# Patient Record
Sex: Female | Born: 1955 | Race: White | Hispanic: No | Marital: Married | Smoking: Former smoker
Health system: Southern US, Community
[De-identification: ages and names within clinical notes are randomized; demographics above are authoritative.]

## PROBLEM LIST (undated history)

## (undated) DIAGNOSIS — K5790 Diverticulosis of intestine, part unspecified, without perforation or abscess without bleeding: Secondary | ICD-10-CM

## (undated) DIAGNOSIS — F329 Major depressive disorder, single episode, unspecified: Secondary | ICD-10-CM

## (undated) DIAGNOSIS — K635 Polyp of colon: Secondary | ICD-10-CM

## (undated) DIAGNOSIS — F32A Depression, unspecified: Secondary | ICD-10-CM

## (undated) DIAGNOSIS — D126 Benign neoplasm of colon, unspecified: Secondary | ICD-10-CM

## (undated) HISTORY — PX: CHOLECYSTECTOMY: SHX55

---

## 2011-03-15 ENCOUNTER — Ambulatory Visit: Payer: Self-pay | Admitting: Family Medicine

## 2011-03-19 ENCOUNTER — Ambulatory Visit: Payer: Self-pay | Admitting: Family Medicine

## 2011-04-27 ENCOUNTER — Ambulatory Visit: Payer: Self-pay | Admitting: Surgery

## 2011-04-27 HISTORY — PX: BREAST BIOPSY: SHX20

## 2011-04-28 LAB — PATHOLOGY REPORT

## 2011-07-09 ENCOUNTER — Ambulatory Visit: Payer: Self-pay | Admitting: Gastroenterology

## 2011-07-09 HISTORY — PX: COLON BIOPSY: SHX1369

## 2011-10-20 ENCOUNTER — Ambulatory Visit: Payer: Self-pay | Admitting: Surgery

## 2012-01-05 ENCOUNTER — Observation Stay: Payer: Self-pay | Admitting: Surgery

## 2012-01-05 LAB — COMPREHENSIVE METABOLIC PANEL
Albumin: 4.2 g/dL (ref 3.4–5.0)
Alkaline Phosphatase: 114 U/L (ref 50–136)
Anion Gap: 9 (ref 7–16)
BUN: 8 mg/dL (ref 7–18)
Calcium, Total: 9.1 mg/dL (ref 8.5–10.1)
Chloride: 102 mmol/L (ref 98–107)
EGFR (Non-African Amer.): >60
Glucose: 109 mg/dL — ABNORMAL HIGH (ref 65–99)
Osmolality: 278 (ref 275–301)
Potassium: 3.7 mmol/L (ref 3.5–5.1)
SGPT (ALT): 389 U/L — ABNORMAL HIGH
Sodium: 140 mmol/L (ref 136–145)
Total Protein: 7.2 g/dL (ref 6.4–8.2)

## 2012-01-05 LAB — URINALYSIS, COMPLETE
Glucose,UR: NEGATIVE mg/dL (ref 0–75)
Leukocyte Esterase: NEGATIVE
Nitrite: NEGATIVE
Protein: 100
WBC UR: 14 /HPF (ref 0–5)

## 2012-01-05 LAB — CBC WITH DIFFERENTIAL/PLATELET
Basophil #: 0 10*3/uL (ref 0.0–0.1)
Lymphocyte %: 14.3 %
MCH: 29.9 pg (ref 26.0–34.0)
MCHC: 33.8 g/dL (ref 32.0–36.0)
Monocyte #: 0.5 10*3/uL (ref 0.0–0.7)
Monocyte %: 5.5 %
Neutrophil %: 79.2 %
Platelet: 242 10*3/uL (ref 150–440)
RDW: 13.2 % (ref 11.5–14.5)
WBC: 8.6 10*3/uL (ref 3.6–11.0)

## 2012-01-05 LAB — LIPASE, BLOOD: Lipase: 102 U/L (ref 73–393)

## 2012-01-07 LAB — PATHOLOGY REPORT

## 2012-05-08 ENCOUNTER — Ambulatory Visit: Payer: Self-pay | Admitting: Family Medicine

## 2012-11-09 ENCOUNTER — Ambulatory Visit: Payer: Self-pay | Admitting: Family Medicine

## 2015-03-30 NOTE — H&P (Signed)
Subjective/Chief Complaint Severe RUQ pain, nausea and emesis    History of Present Illness 59 year old female awoke with dudden onset on upper back pain and RUQ/epigastric pain followed by nausea and emesis at 3 am tuesday am.  She has been unable to keep po as it is followed by emesis.  Nearly constant discomfort since onset of symptoms.  About 1 tear ago had similar but less intense and shorter lived type pain/symptom complex.  No fevers, no jaundice, no sick contacts.  3 previous c-sections.  Colonoscopy aug 2012 normal.    Past History as above   ALLERGIES:  No Known Allergies:     Medications takes no medications regularly.   Family and Social History:   Family History Non-Contributory    Social History positive  tobacco, positive ETOH, occ. use of both    + Tobacco Current (within 1 year)    Place of Living Home   Review of Systems:   Subjective/Chief Complaint as described above    Abdominal Pain Yes    Constipation No    Nausea/Vomiting Yes    Tolerating Diet No  Nauseated  Vomiting    Medications/Allergies Reviewed Medications/Allergies reviewed   Physical Exam:   GEN NAD, disheveled, temp 97.7 73 bp 149/59    HEENT pale conjunctivae    NECK supple    RESP normal resp effort  clear BS    CARD regular rate  no murmur  no thrills  No LE edema  no JVD  no Rub    ABD positive tenderness  no hernia  normal BS  positive murphy's sign, no mass, no hernia, lower midline scar present.    EXTR positive cyanosis/clubbing, negative cyanosis/clubbing    SKIN normal to palpation    NEURO cranial nerves intact    PSYCH A+O to time, place, person     Routine UA:  30-Jan-13 14:37    Color (UA) Amber   Clarity (UA) Turbid   Glucose (UA) Negative   Bilirubin (UA) Negative   Ketones (UA) Negative   Specific Gravity (UA) 1.023   Blood (UA) Negative   pH (UA) 8.0   Nitrite (UA) Negative   Leukocyte Esterase (UA) Negative   RBC (UA) 4 /HPF   WBC (UA) 14  /HPF   Bacteria (UA) TRACE   Epithelial Cells (UA) 1 /HPF   Mucous (UA) PRESENT   Budding Yeast (UA) PRESENT  Routine Chem:  30-Jan-13 15:06    Lipase 102  Routine Hem:  30-Jan-13 15:06    WBC (CBC) 8.6   RBC (CBC) 4.91   Hemoglobin (CBC) 14.7   Hematocrit (CBC) 43.4   Platelet Count (CBC) 242   MCV 89   MCH 29.9   MCHC 33.8   RDW 13.2   Neutrophil % 79.2   Lymphocyte % 14.3   Monocyte % 5.5   Eosinophil % 0.7   Basophil % 0.3   Neutrophil # 6.8   Lymphocyte # 1.2   Monocyte # 0.5   Eosinophil # 0.1   Basophil # 0.0  Routine Chem:  30-Jan-13 15:06    Glucose, Serum 109   BUN 8   Creatinine (comp) 0.72   Sodium, Serum 140   Potassium, Serum 3.7   Chloride, Serum 102   CO2, Serum 29   Calcium (Total), Serum 9.1  Hepatic:  30-Jan-13 15:06    Bilirubin, Total 1.6   Alkaline Phosphatase 114   SGPT (ALT) 389   SGOT (AST) 301  Total Protein, Serum 7.2   Albumin, Serum 4.2  Routine Chem:  30-Jan-13 15:06    Osmolality (calc) 278   eGFR (African American) >60   eGFR (Non-African American) >60   Anion Gap 9   Radiology Results:  [place]:    30-Jan-13 17:48, US Abdomen General Survey   US Abdomen General Survey   REASON FOR EXAM:    upper abd pain radiating to back; elevated lfts  COMMENTS:       PROCEDURE: [place]  - US ABDOMEN GENERAL SURVEY  - Jan 05 2012  5:48PM     RESULT: Comparison: None.    Technique: Multiple grayscale and color Doppler images were obtained of   the abdomen.    Findings:  The liver is diffusely echogenic and dense, consistent with hepatic   steatosis. The visualized spleen and pancreas are unremarkable. There are   multiple mobile stones in the gallbladder. The sonographic Percell Miller sign  was negative. No pericholecystic fluid or gallbladder wall thickening.     There is mild prominence of the intrahepatic biliary ducts. The common   bile duct measures 8 mm.    Images of the kidneys showed no hydronephrosis.    IMPRESSION:   1.  Cholelithiasis, without sonographic evidence of acute cholecystitis.  2. The common bile duct is dilated. The etiology for this dilatation is   not identified on this study. Further evaluation could be provided with   M.R.C.P. or ERCP, as indicated.  3. Hepatic steatosis.          Verified By: Gregor Hams, M.D., MD     Assessment/Admission Diagnosis 59 year old with acute cholecystitis    Plan Admit, npo, lap chole with IOC later tonight.  Discussed with patient and husband procedure including risks of surgery including bile duct injury, bleeding, infection, need for conversion to open operation.  [place] personally reviewed on PACS.  total time spent 45 minutes.   Electronic Signatures: Sherri Rad (MD)  (Signed 30-Jan-13 20:29)  Authored: CHIEF COMPLAINT and HISTORY, ALLERGIES, HOME MEDICATIONS, OTHER MEDICATIONS, FAMILY AND SOCIAL HISTORY, REVIEW OF SYSTEMS, PHYSICAL EXAM, LABS, Radiology, ASSESSMENT AND PLAN   Last Updated: 30-Jan-13 20:29 by Sherri Rad (MD)

## 2015-03-30 NOTE — Op Note (Signed)
PATIENT NAME:  Kristen Walls, Kristen Walls MR#:  [id number] DATE OF BIRTH:  05-29-1956  DATE OF PROCEDURE:  01/05/2012  PREOPERATIVE DIAGNOSIS: Acute calculus cholecystitis.   POSTOPERATIVE DIAGNOSIS: Acute calculus cholecystitis.   PROCEDURE PERFORMED: Laparoscopic cholecystectomy.   SURGEON: Sherri Rad, M.D. FACS  ASSISTANT: Surgical scrub technologist.   TYPE OF ANESTHESIA: General endotracheal, Dr. Vashti Hey   FINDINGS: Acute calculus cholecystitis.   SPECIMENS: Gallbladder with contents.   ESTIMATED BLOOD LOSS: 50 mL.   DRAINS: 19-mm Blake drain.   DESCRIPTION OF PROCEDURE: With the patient in the supine position, general oral endotracheal anesthesia was induced. SCDs and Invanz were administered in an appropriate preoperative fashion. The patient's left arm was padded and tucked at her side. A time-out was observed. A 12-mm blunt Hassan trocar was placed through an infraumbilical transversely- oriented skin incision with stay sutures being passed through the fascia. Pneumoperitoneum was established to a total of 15 mmHg. The patient was then positioned in reverse Trendelenburg and airplaned right side up. A 5-mm Bladeless trocar was placed in the epigastrium. Two 5-mm Bladeless ports were then placed in the right subcostal margin. The gallbladder was aspirated of approximately 30 mL of thick bile. This allowed [place] to place a grasper on the fundus. The gallbladder was then elevated towards the right shoulder. Lateral traction was placed on Hartman's pouch. Dissection in this area demonstrated a cystic duct which was triply clipped on the portal side, singly clipped on the gallbladder side, and divided. Immediately adjacent, a cystic artery was identified. Two clips were then placed on the portal side, a single clip on the gallbladder side, and divided. One small posterior cystic artery branch was divided between two clips on the liver side, one clip on the gallbladder side, and sharp scissors. The  gallbladder was then retrieved off the gallbladder fossa utilizing hook cautery apparatus and placed into an Endo Catch device.  It  was then retrieved through the infraumbilical port site without difficulty. Pneumoperitoneum was re-established. The right upper quadrant was irrigated of all free bile with a total of 2 liters of warm normal saline and aspirated dry. A 19-millimeter Blake drain was directed into the space below the gallbladder fossa and exited the lowermost right upper quadrant port site. It was placed into the gallbladder fossa. Drain site was secured with nylon suture. Ports were then removed under direct visualization. Hemostasis appeared to be adequate.   The infraumbilical fascial defect was closed with an additional figure-of-eight #0 Vicryl suture in vertical orientation. The existing stay sutures were tied to each other. A total of 25 mL of 0.25% plain Marcaine was infiltrated along all skin and fascial incisions prior to closure.  4-0 Monocryl was used in subcuticular fashion to reapproximate skin edges. Benzoin, Steri-Strips, Telfa, and Tegaderm were then applied. The patient was then subsequently extubated and taken to the recovery room in stable and satisfactory condition by anesthesia services.     ____________________________ Jeannette How Marina Gravel, MD FACS mab:bjt D: 01/05/2012 23:55:37 ET T: 01/06/2012 10:05:26 ET JOB#: [id number]  cc: Elta Guadeloupe A. Marina Gravel, MD, <Dictator> Hortencia Conradi MD ELECTRONICALLY SIGNED 01/06/2012 21:39

## 2016-04-23 ENCOUNTER — Other Ambulatory Visit: Payer: Self-pay | Admitting: Family Medicine

## 2016-04-23 DIAGNOSIS — Z1231 Encounter for screening mammogram for malignant neoplasm of breast: Secondary | ICD-10-CM

## 2016-05-12 ENCOUNTER — Ambulatory Visit
Admission: RE | Admit: 2016-05-12 | Discharge: 2016-05-12 | Disposition: A | Discharge status: Home / Self Care | Source: Ambulatory Visit | Attending: Family Medicine | Admitting: Family Medicine

## 2016-05-12 DIAGNOSIS — Z1231 Encounter for screening mammogram for malignant neoplasm of breast: Secondary | ICD-10-CM | POA: Insufficient documentation

## 2016-05-18 ENCOUNTER — Other Ambulatory Visit: Payer: Self-pay | Admitting: Family Medicine

## 2016-05-18 DIAGNOSIS — R928 Other abnormal and inconclusive findings on diagnostic imaging of breast: Secondary | ICD-10-CM

## 2016-06-03 ENCOUNTER — Ambulatory Visit
Admission: RE | Admit: 2016-06-03 | Discharge: 2016-06-03 | Disposition: A | Discharge status: Home / Self Care | Source: Ambulatory Visit | Attending: Family Medicine | Admitting: Family Medicine

## 2016-06-03 DIAGNOSIS — R928 Other abnormal and inconclusive findings on diagnostic imaging of breast: Secondary | ICD-10-CM

## 2016-06-03 DIAGNOSIS — N63 Unspecified lump in breast: Secondary | ICD-10-CM | POA: Diagnosis not present

## 2017-03-10 ENCOUNTER — Encounter: Payer: Self-pay | Admitting: *Deleted

## 2017-03-11 ENCOUNTER — Ambulatory Visit: Admitting: Certified Registered Nurse Anesthetist

## 2017-03-11 ENCOUNTER — Encounter
Admission: RE | Disposition: A | Discharge status: Home / Self Care | Payer: Self-pay | Source: Ambulatory Visit | Attending: Gastroenterology

## 2017-03-11 ENCOUNTER — Ambulatory Visit
Admission: RE | Admit: 2017-03-11 | Discharge: 2017-03-11 | Disposition: A | Discharge status: Home / Self Care | Source: Ambulatory Visit | Attending: Gastroenterology | Admitting: Gastroenterology

## 2017-03-11 ENCOUNTER — Encounter: Payer: Self-pay | Admitting: Anesthesiology

## 2017-03-11 DIAGNOSIS — Z86010 Personal history of colonic polyps: Secondary | ICD-10-CM | POA: Insufficient documentation

## 2017-03-11 DIAGNOSIS — K5730 Diverticulosis of large intestine without perforation or abscess without bleeding: Secondary | ICD-10-CM | POA: Diagnosis not present

## 2017-03-11 DIAGNOSIS — F17200 Nicotine dependence, unspecified, uncomplicated: Secondary | ICD-10-CM | POA: Insufficient documentation

## 2017-03-11 DIAGNOSIS — K640 First degree hemorrhoids: Secondary | ICD-10-CM | POA: Insufficient documentation

## 2017-03-11 DIAGNOSIS — Z79899 Other long term (current) drug therapy: Secondary | ICD-10-CM | POA: Insufficient documentation

## 2017-03-11 DIAGNOSIS — F329 Major depressive disorder, single episode, unspecified: Secondary | ICD-10-CM | POA: Insufficient documentation

## 2017-03-11 DIAGNOSIS — Z1211 Encounter for screening for malignant neoplasm of colon: Secondary | ICD-10-CM | POA: Diagnosis present

## 2017-03-11 DIAGNOSIS — D123 Benign neoplasm of transverse colon: Secondary | ICD-10-CM | POA: Insufficient documentation

## 2017-03-11 DIAGNOSIS — K621 Rectal polyp: Secondary | ICD-10-CM | POA: Diagnosis not present

## 2017-03-11 DIAGNOSIS — Z888 Allergy status to other drugs, medicaments and biological substances status: Secondary | ICD-10-CM | POA: Insufficient documentation

## 2017-03-11 DIAGNOSIS — K635 Polyp of colon: Secondary | ICD-10-CM | POA: Insufficient documentation

## 2017-03-11 HISTORY — PX: COLONOSCOPY WITH PROPOFOL: SHX5780

## 2017-03-11 HISTORY — DX: Major depressive disorder, single episode, unspecified: F32.9

## 2017-03-11 HISTORY — DX: Depression, unspecified: F32.A

## 2017-03-11 SURGERY — COLONOSCOPY WITH PROPOFOL
Anesthesia: General

## 2017-03-11 MED ORDER — FENTANYL CITRATE (PF) 100 MCG/2ML IJ SOLN
INTRAMUSCULAR | Status: DC | PRN
  Administered 2017-03-11: 50 ug via INTRAVENOUS

## 2017-03-11 MED ORDER — LIDOCAINE HCL (PF) 2 % IJ SOLN
INTRAMUSCULAR | Status: DC | PRN
  Administered 2017-03-11: 40 mg via INTRADERMAL
  Administered 2017-03-11: 60 mg via INTRADERMAL

## 2017-03-11 MED ORDER — PROPOFOL 10 MG/ML IV BOLUS
INTRAVENOUS | Status: DC | PRN
  Administered 2017-03-11: 20 mg via INTRAVENOUS
  Administered 2017-03-11: 70 mg via INTRAVENOUS

## 2017-03-11 MED ORDER — GLYCOPYRROLATE 0.2 MG/ML IJ SOLN
INTRAMUSCULAR | Status: DC | PRN
  Administered 2017-03-11: 0.2 mg via INTRAVENOUS

## 2017-03-11 MED ORDER — PROPOFOL 500 MG/50ML IV EMUL
INTRAVENOUS | Status: AC
  Filled 2017-03-11: qty 50

## 2017-03-11 MED ORDER — PHENYLEPHRINE HCL 10 MG/ML IJ SOLN
INTRAMUSCULAR | Status: DC | PRN
  Administered 2017-03-11 (×5): 100 ug via INTRAVENOUS

## 2017-03-11 MED ORDER — PROPOFOL 500 MG/50ML IV EMUL
INTRAVENOUS | Status: DC | PRN
  Administered 2017-03-11: 150 ug/kg/min via INTRAVENOUS

## 2017-03-11 MED ORDER — SODIUM CHLORIDE 0.9 % IV SOLN: INTRAVENOUS | Status: DC

## 2017-03-11 MED ORDER — LIDOCAINE HCL (PF) 2 % IJ SOLN
INTRAMUSCULAR | Status: AC
  Filled 2017-03-11: qty 2

## 2017-03-11 MED ORDER — SODIUM CHLORIDE 0.9 % IV SOLN
INTRAVENOUS | Status: DC
  Administered 2017-03-11: 11:00:00 via INTRAVENOUS

## 2017-03-11 MED ORDER — FENTANYL CITRATE (PF) 100 MCG/2ML IJ SOLN
INTRAMUSCULAR | Status: AC
  Filled 2017-03-11: qty 2

## 2017-03-11 NOTE — Op Note (Signed)
[facility] Patient Name: Kristen Walls Procedure Date: 03/11/2017 10:49 AM MRN: [id number] Account #: [id number] Date of Birth: November 04, 1956 Admit Type: Outpatient Age: 61 Room: [facility] ENDO ROOM 1 Gender: Female Note Status: Finalized Procedure:            Colonoscopy Indications:          Personal history of colonic polyps Providers:            Lollie Sails, MD Referring MD:         Irven Easterly. Kary Kos, MD (Referring MD) Medicines:            Monitored Anesthesia Care Complications:        No immediate complications. Procedure:            Pre-Anesthesia Assessment:                       - ASA Grade Assessment: III - A patient with severe                        systemic disease.                       After obtaining informed consent, the colonoscope was                        passed under direct vision. Throughout the procedure,                        the patient's blood pressure, pulse, and oxygen                        saturations were monitored continuously. The                        Colonoscope was introduced through the anus and                        advanced to the the cecum, identified by appendiceal                        orifice and ileocecal valve. The colonoscopy was                        unusually difficult due to poor bowel prep and a                        tortuous colon. Successful completion of the procedure                        was aided by changing the patient to a supine position,                        changing the patient to a prone position and lavage.                        The patient tolerated the procedure well. The quality                        of the bowel preparation was fair except the cecum was  poor. Findings:      A 16 mm polyp was found in the proximal transverse colon. The polyp was       sessile. The polyp was removed with a cold biopsy forceps. The polyp was       removed with a  cold snare. The polyp was removed with a lift and cut       technique using a cold snare. The polyp was removed with a piecemeal       technique using a cold snare. Resection and retrieval were complete.      A 3 mm polyp was found in the ileocecal valve. The polyp was sessile.       The polyp was removed with a cold biopsy forceps. Resection and       retrieval were complete.      Five sessile polyps were found in the distal sigmoid colon. The polyps       were 1 to 3 mm in size. These polyps were removed with a cold biopsy       forceps. Resection and retrieval were complete.      A 5 mm polyp was found in the rectum. The polyp was sessile. The polyp       was removed with a cold snare. Resection and retrieval were complete.      Multiple small-mouthed diverticula were found in the sigmoid colon and       descending colon.      Non-bleeding external and internal hemorrhoids were found during       anoscopy. The hemorrhoids were small and Grade I (internal hemorrhoids       that do not prolapse).      The digital rectal exam was normal.      No additional abnormalities were found on retroflexion. Impression:           - One 16 mm polyp in the proximal transverse colon,                        removed with a cold snare, removed using lift and cut                        and a cold snare, removed piecemeal using a cold snare                        and removed with a cold biopsy forceps. Resected and                        retrieved.                       - One 3 mm polyp at the ileocecal valve, removed with a                        cold biopsy forceps. Resected and retrieved.                       - Five 1 to 3 mm polyps in the distal sigmoid colon,                        removed with a cold biopsy forceps. Resected and  retrieved.                       - One 5 mm polyp in the rectum, removed with a cold                        snare. Resected and retrieved.                        - Diverticulosis in the sigmoid colon and in the                        descending colon. Recommendation:       - Await pathology results.                       - Telephone GI clinic for pathology results in 1 week. Procedure Code(s):    --- Professional ---                       [phone], Colonoscopy, flexible; with removal of tumor(s),                        polyp(s), or other lesion(s) by snare technique                       [id number], 28, Colonoscopy, flexible; with biopsy, single                        or multiple Diagnosis Code(s):    --- Professional ---                       D12.3, Benign neoplasm of transverse colon (hepatic                        flexure or splenic flexure)                       D12.0, Benign neoplasm of cecum                       K62.1, Rectal polyp                       D12.5, Benign neoplasm of sigmoid colon                       Z86.010, Personal history of colonic polyps                       K57.30, Diverticulosis of large intestine without                        perforation or abscess without bleeding CPT copyright 2016 American Medical Association. All rights reserved. The codes documented in this report are preliminary and upon coder review may  be revised to meet current compliance requirements. Lollie Sails, MD 03/11/2017 11:53:21 AM This report has been signed electronically. Number of Addenda: 0 Note Initiated On: 03/11/2017 10:49 AM Scope Withdrawal Time: 0 hours 32 minutes 9 seconds  Total Procedure Duration: 0 hours 44 minutes 14 seconds       [facility]

## 2017-03-11 NOTE — Anesthesia Preprocedure Evaluation (Signed)
Anesthesia Evaluation  Patient identified by MRN, date of birth, ID band Patient awake    Reviewed: Allergy & Precautions, NPO status , Patient's Chart, lab work & pertinent test results  Airway Mallampati: III       Dental   Pulmonary Current Smoker,    Pulmonary exam normal        Cardiovascular negative cardio ROS Normal cardiovascular exam     Neuro/Psych PSYCHIATRIC DISORDERS Depression    GI/Hepatic negative GI ROS, Neg liver ROS,   Endo/Other  negative endocrine ROS  Renal/GU negative Renal ROS  negative genitourinary   Musculoskeletal negative musculoskeletal ROS (+)   Abdominal Normal abdominal exam  (+)   Peds negative pediatric ROS (+)  Hematology negative hematology ROS (+)   Anesthesia Other Findings   Reproductive/Obstetrics                             Anesthesia Physical Anesthesia Plan  ASA: II  Anesthesia Plan: General   Post-op Pain Management:    Induction: Intravenous  Airway Management Planned: Nasal Cannula  Additional Equipment:   Intra-op Plan:   Post-operative Plan:   Informed Consent: I have reviewed the patients History and Physical, chart, labs and discussed the procedure including the risks, benefits and alternatives for the proposed anesthesia with the patient or authorized representative who has indicated his/her understanding and acceptance.   Dental advisory given  Plan Discussed with: CRNA and Surgeon  Anesthesia Plan Comments:         Anesthesia Quick Evaluation

## 2017-03-11 NOTE — Transfer of Care (Signed)
Immediate Anesthesia Transfer of Care Note  Patient: Kristen Walls  Procedure(s) Performed: Procedure(s): COLONOSCOPY WITH PROPOFOL (N/A)  Patient Location: PACU  Anesthesia Type:General  Level of Consciousness: awake and alert   Airway & Oxygen Therapy: Patient Spontanous Breathing and Patient connected to nasal cannula oxygen  Post-op Assessment: Report given to RN and Post -op Vital signs reviewed and stable  Post vital signs: Reviewed and stable  Last Vitals:  Vitals:   03/11/17 0946  BP: 110/67  Pulse: 83  Resp: 16  Temp: (!) 36 C    Last Pain:  Vitals:   03/11/17 0946  TempSrc: Tympanic         Complications: No apparent anesthesia complications

## 2017-03-11 NOTE — H&P (Signed)
Outpatient short stay form Pre-procedure 03/11/2017 10:40 AM Kristen Sails MD  Primary Physician: Dr. [place] Pink  Reason for visit:  Colonoscopy  History of present illness:  Patient is a 61 year old female presenting today as above. She tolerated her prep well. She takes no aspirin or blood thinning agents. Her last colonoscopy was in 2012. At that time there were 2 adenomas removed as well as a hyperplastic polyp.    Current Facility-Administered Medications:  .  0.9 %  sodium chloride infusion, , Intravenous, Continuous, Kristen Sails, MD .  0.9 %  sodium chloride infusion, , Intravenous, Continuous, Kristen Sails, MD  Prescriptions Prior to Admission  Medication Sig Dispense Refill Last Dose  . cholecalciferol (VITAMIN D) 400 units TABS tablet Take 1,000 Units by mouth.   Past Week at Unknown time  . Multiple Vitamin (MULTIVITAMIN) tablet Take 1 tablet by mouth daily.   Past Week at Unknown time  . OMEGA-3 FATTY ACIDS-VITAMIN E PO Take 1,000 mg by mouth daily.   Past Week at Unknown time  . sertraline (ZOLOFT) 50 MG tablet Take 50 mg by mouth daily.   03/10/2017 at Unknown time  . varenicline (CHANTIX) 1 MG tablet Take 1 mg by mouth 2 (two) times daily.   Not Taking at Unknown time     Allergies  Allergen Reactions  . Wellbutrin [Bupropion] Hives     Past Medical History:  Diagnosis Date  . Depression     Review of systems:      Physical Exam    Heart and lungs: Regular rate and rhythm without rub or gallop, lungs are bilaterally clear.    HEENT: Normocephalic atraumatic eyes are anicteric    Other:     Pertinant exam for procedure: Soft nontender nondistended bowel sounds positive normoactive.   Planned proceedures: Colonoscopy and indicated procedures I have discussed the risks benefits and complications of procedures to include not limited to bleeding, infection, perforation and the risk of sedation and the patient wishes to proceed.    Kristen Sails, MD Gastroenterology 03/11/2017  10:40 AM

## 2017-03-11 NOTE — Anesthesia Post-op Follow-up Note (Cosign Needed)
Anesthesia QCDR form completed.        

## 2017-03-14 ENCOUNTER — Encounter: Payer: Self-pay | Admitting: Gastroenterology

## 2017-03-14 LAB — SURGICAL PATHOLOGY

## 2017-03-16 NOTE — Anesthesia Postprocedure Evaluation (Signed)
Anesthesia Post Note  Patient: Kristen Walls  Procedure(s) Performed: Procedure(s) (LRB): COLONOSCOPY WITH PROPOFOL (N/A)  Patient location during evaluation: Endoscopy Anesthesia Type: General Level of consciousness: awake and alert Pain management: pain level controlled Vital Signs Assessment: post-procedure vital signs reviewed and stable Respiratory status: spontaneous breathing, nonlabored ventilation, respiratory function stable and patient connected to nasal cannula oxygen Cardiovascular status: blood pressure returned to baseline and stable Postop Assessment: no signs of nausea or vomiting Anesthetic complications: no     Last Vitals:  Vitals:   03/11/17 1226 03/11/17 1236  BP: 113/60 130/63  Pulse: 68 62  Resp: 17 15  Temp:      Last Pain:  Vitals:   03/12/17 1326  TempSrc:   PainSc: 0-No pain                 Martha Clan

## 2017-05-06 ENCOUNTER — Other Ambulatory Visit: Payer: Self-pay | Admitting: Family Medicine

## 2017-05-06 DIAGNOSIS — Z1231 Encounter for screening mammogram for malignant neoplasm of breast: Secondary | ICD-10-CM

## 2017-05-24 ENCOUNTER — Ambulatory Visit
Admission: RE | Admit: 2017-05-24 | Discharge: 2017-05-24 | Disposition: A | Discharge status: Home / Self Care | Source: Ambulatory Visit | Attending: Family Medicine | Admitting: Family Medicine

## 2017-05-24 DIAGNOSIS — Z1231 Encounter for screening mammogram for malignant neoplasm of breast: Secondary | ICD-10-CM | POA: Diagnosis not present

## 2017-10-23 IMAGING — MG MM DIGITAL SCREENING BILAT W/ CAD
4 series · 4 of 4 positions shown · non-contrast
Comparison: Previous exam(s).

CLINICAL DATA: Screening.

EXAM:
DIGITAL SCREENING BILATERAL MAMMOGRAM WITH CAD

[L MLO]
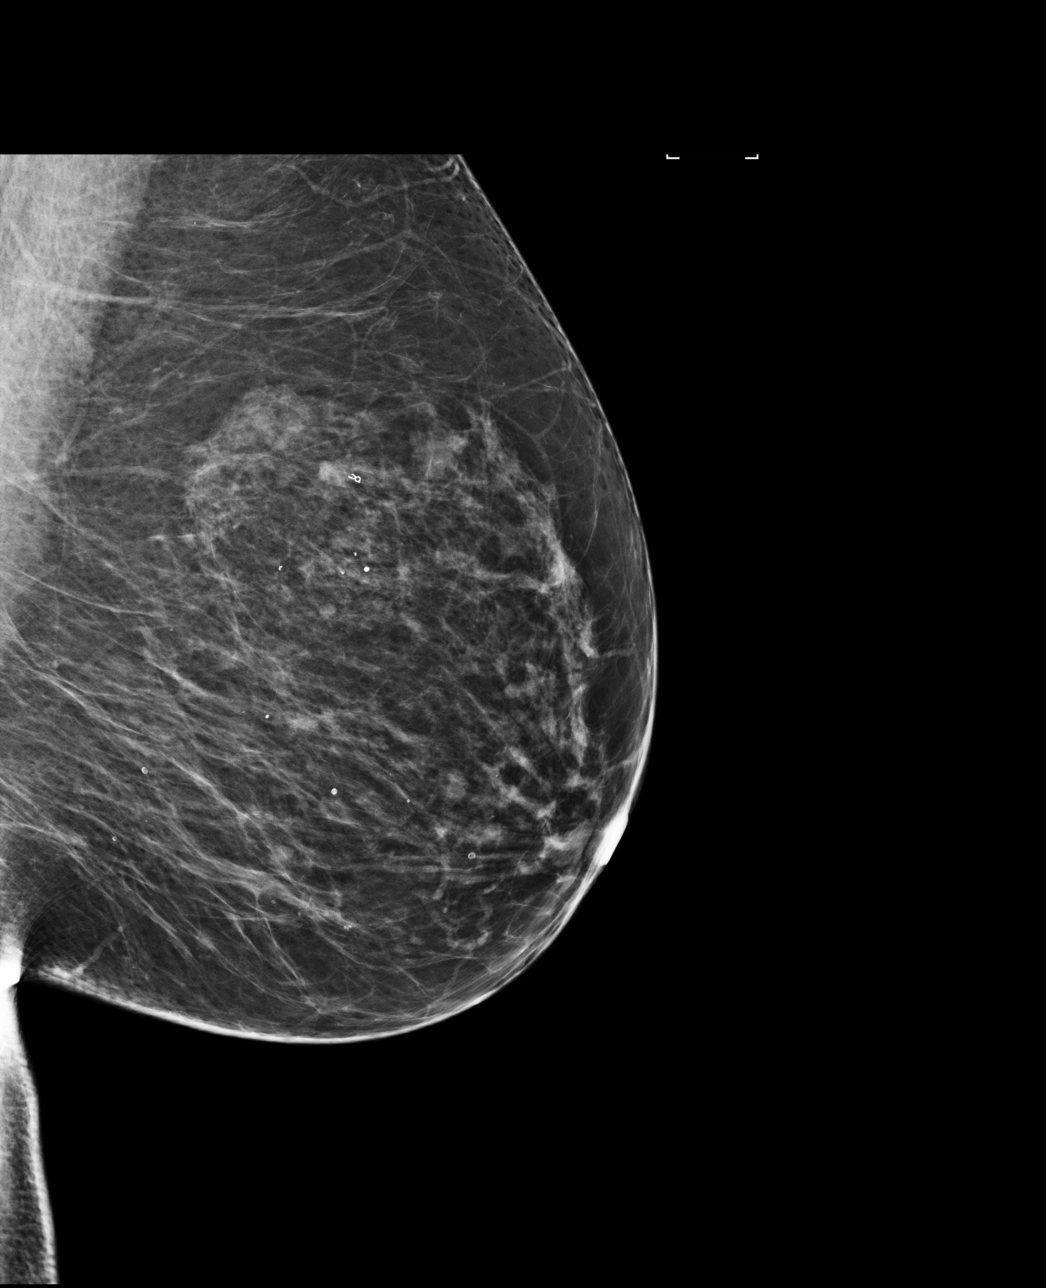

[R CC]
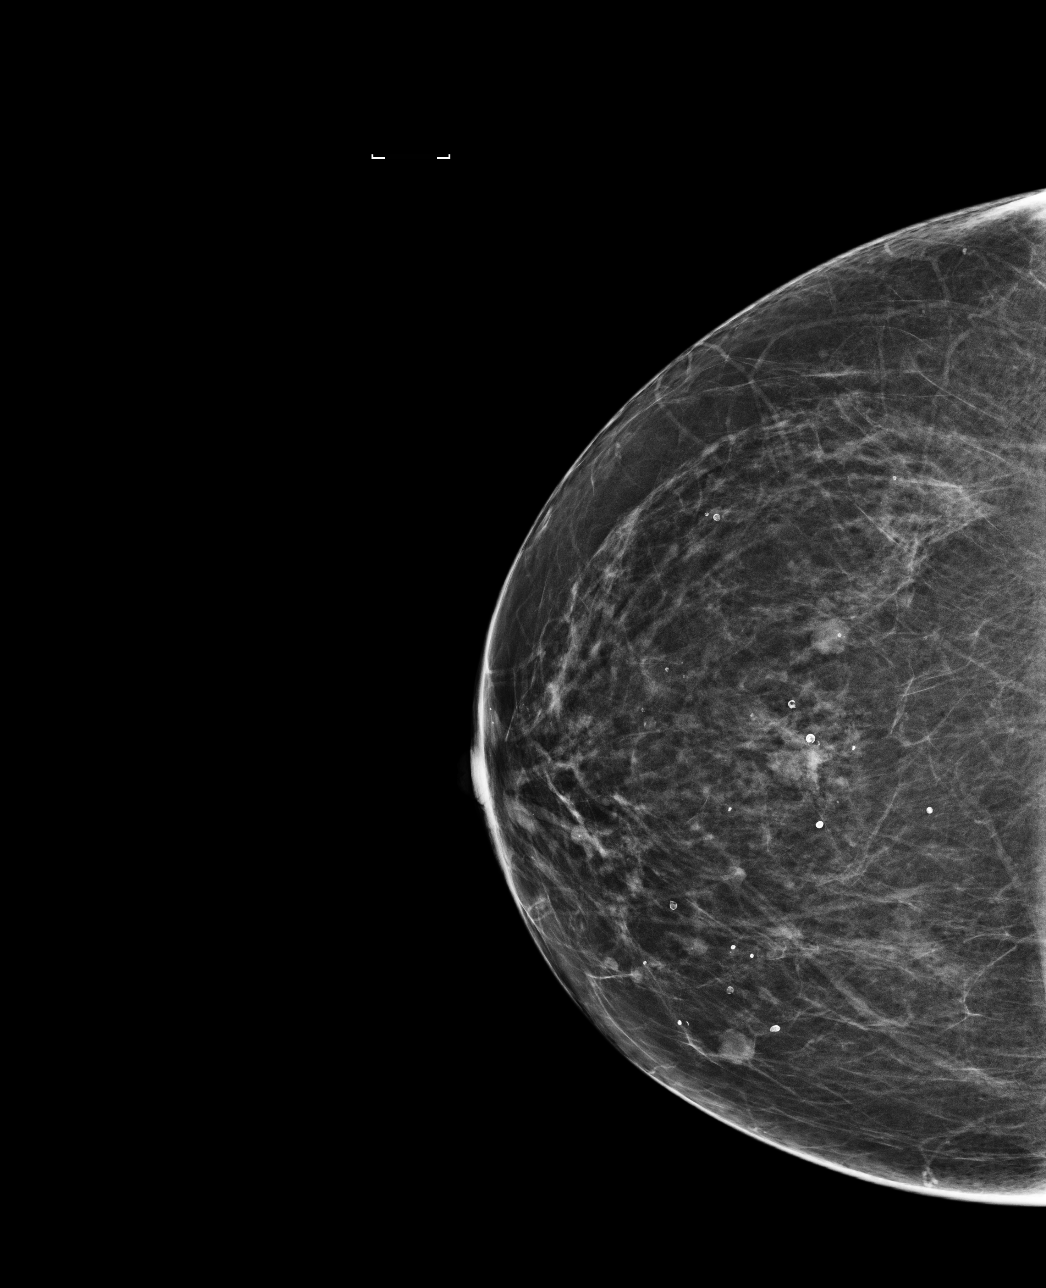

[L CC]
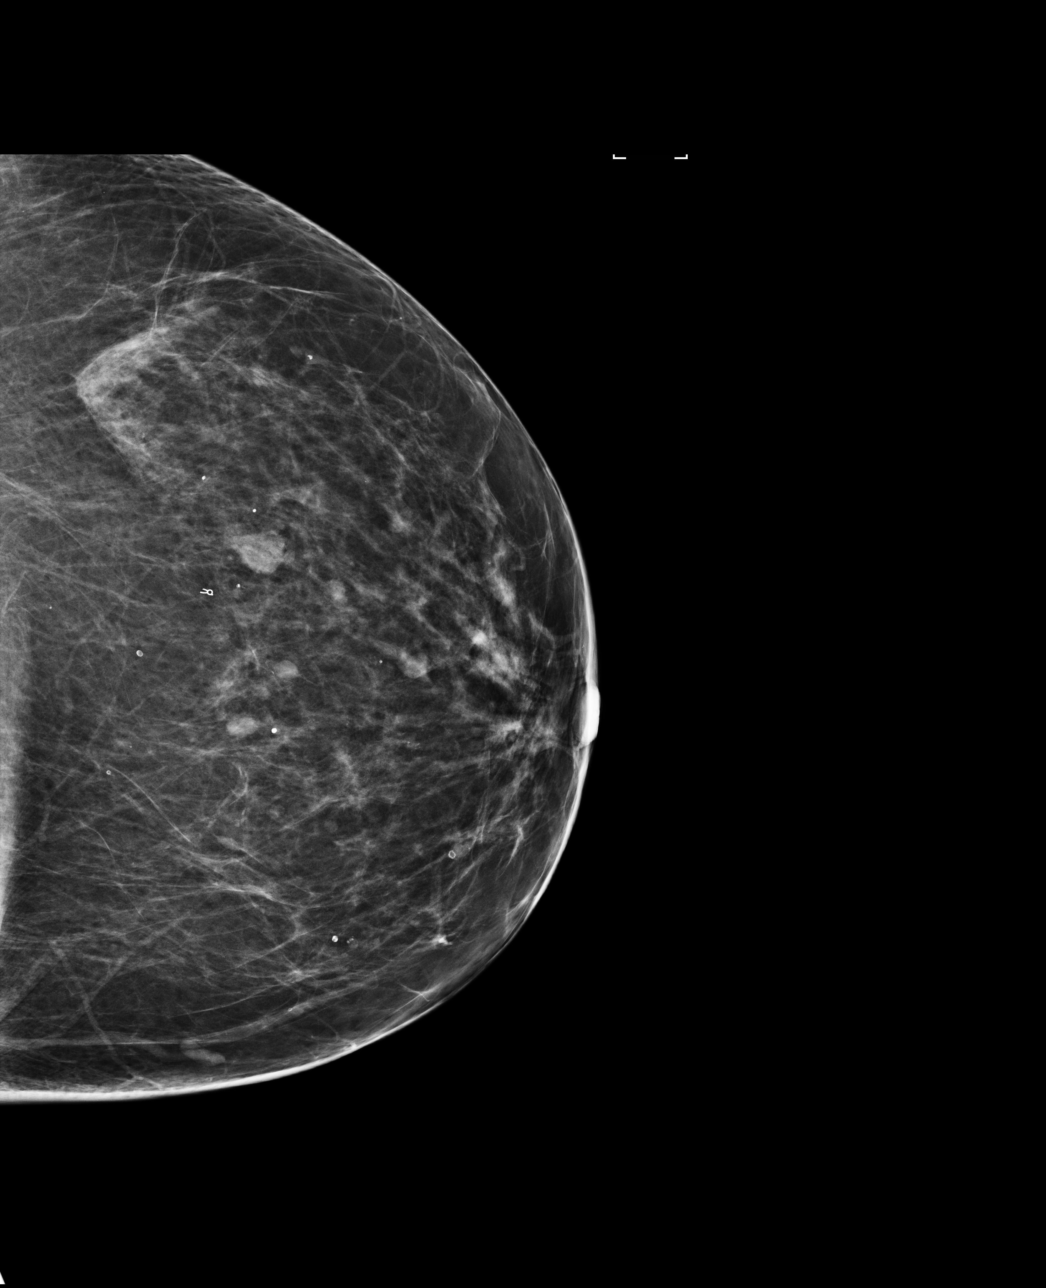

[R MLO]
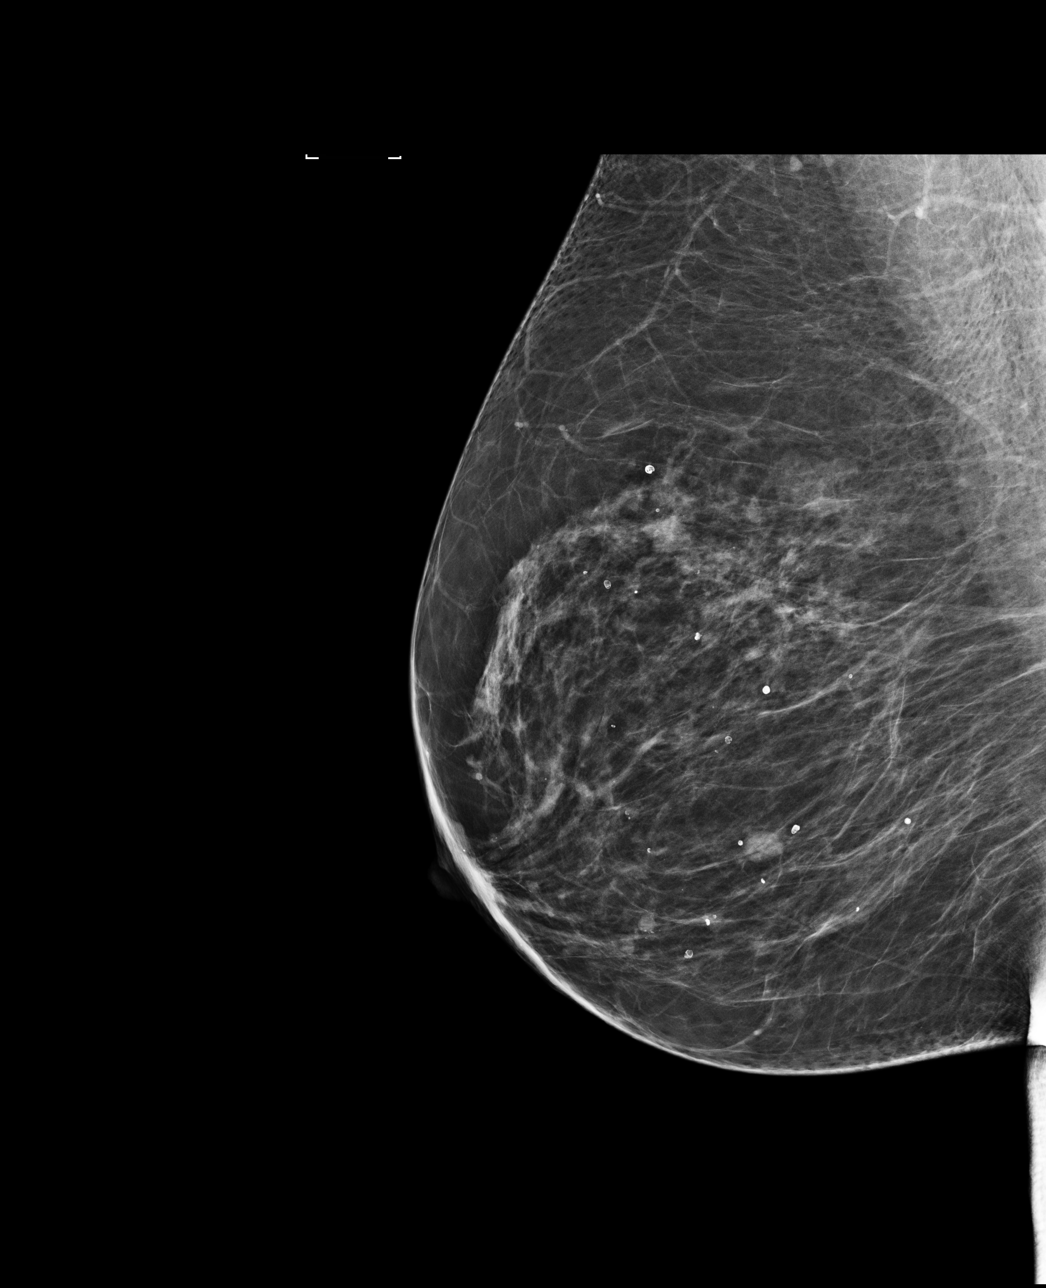

[4 of 4 positions shown; findings below may reference images not displayed]

ACR Breast Density Category b: There are scattered areas of
fibroglandular density.
FINDINGS: In the left breast, a possible mass warrants further evaluation. In
the right breast, no findings suspicious for malignancy.

Images were processed with CAD.
IMPRESSION: Further evaluation is suggested for possible mass in the left
breast.

RECOMMENDATION:
Diagnostic mammogram and possibly ultrasound of the left breast.
(Code:5M-3-PPT)

The patient will be contacted regarding the findings, and additional
imaging will be scheduled.

BI-RADS CATEGORY  0: Incomplete. Need additional imaging evaluation
and/or prior mammograms for comparison.

## 2018-06-19 ENCOUNTER — Other Ambulatory Visit: Payer: Self-pay | Admitting: Family Medicine

## 2018-06-19 DIAGNOSIS — Z1231 Encounter for screening mammogram for malignant neoplasm of breast: Secondary | ICD-10-CM

## 2018-07-05 ENCOUNTER — Ambulatory Visit
Admission: RE | Admit: 2018-07-05 | Discharge: 2018-07-05 | Disposition: A | Discharge status: Home / Self Care | Source: Ambulatory Visit | Attending: Family Medicine | Admitting: Family Medicine

## 2018-07-05 DIAGNOSIS — Z1231 Encounter for screening mammogram for malignant neoplasm of breast: Secondary | ICD-10-CM

## 2018-07-24 ENCOUNTER — Other Ambulatory Visit: Payer: Self-pay | Admitting: Gastroenterology

## 2018-07-24 DIAGNOSIS — R748 Abnormal levels of other serum enzymes: Secondary | ICD-10-CM

## 2018-08-02 ENCOUNTER — Ambulatory Visit
Admission: RE | Admit: 2018-08-02 | Discharge: 2018-08-02 | Disposition: A | Discharge status: Home / Self Care | Source: Ambulatory Visit | Attending: Gastroenterology | Admitting: Gastroenterology

## 2018-08-02 DIAGNOSIS — D7389 Other diseases of spleen: Secondary | ICD-10-CM | POA: Diagnosis not present

## 2018-08-02 DIAGNOSIS — Z9049 Acquired absence of other specified parts of digestive tract: Secondary | ICD-10-CM | POA: Insufficient documentation

## 2018-08-02 DIAGNOSIS — R748 Abnormal levels of other serum enzymes: Secondary | ICD-10-CM | POA: Diagnosis present

## 2018-08-02 DIAGNOSIS — K7689 Other specified diseases of liver: Secondary | ICD-10-CM | POA: Diagnosis not present

## 2019-05-28 ENCOUNTER — Other Ambulatory Visit: Payer: Self-pay | Admitting: Family Medicine

## 2019-06-20 ENCOUNTER — Other Ambulatory Visit: Payer: Self-pay | Admitting: Family Medicine

## 2019-06-20 DIAGNOSIS — Z1231 Encounter for screening mammogram for malignant neoplasm of breast: Secondary | ICD-10-CM

## 2019-07-24 ENCOUNTER — Ambulatory Visit
Admission: RE | Admit: 2019-07-24 | Discharge: 2019-07-24 | Disposition: A | Discharge status: Home / Self Care | Source: Ambulatory Visit | Attending: Family Medicine | Admitting: Family Medicine

## 2019-07-24 DIAGNOSIS — Z1231 Encounter for screening mammogram for malignant neoplasm of breast: Secondary | ICD-10-CM | POA: Diagnosis not present

## 2020-02-10 ENCOUNTER — Ambulatory Visit: Payer: Medicare Other | Attending: Internal Medicine

## 2020-02-10 DIAGNOSIS — Z23 Encounter for immunization: Secondary | ICD-10-CM

## 2020-02-10 NOTE — Progress Notes (Signed)
   Covid-19 Vaccination Clinic  Name:  Kristen Walls    MRN: [id number] DOB: [birth year]  02/10/2020  [name] was observed post Covid-19 immunization for 15 minutes without incident. She was provided with Vaccine Information Sheet and instruction to access the V-Safe system.   [name] was instructed to call 911 with any severe reactions post vaccine: [name] Kitchen Difficulty breathing  . Swelling of face and throat  . A fast heartbeat  . A bad rash all over body  . Dizziness and weakness   Immunizations Administered    Name Date Dose VIS Date Route   Pfizer COVID-19 Vaccine 02/10/2020  9:18 AM 0.3 mL 11/16/2019 Intramuscular   Manufacturer: [place]   Lot: [id number]   [place]: [id number]

## 2020-03-04 ENCOUNTER — Ambulatory Visit: Payer: Medicare Other | Attending: Internal Medicine

## 2020-03-04 DIAGNOSIS — Z23 Encounter for immunization: Secondary | ICD-10-CM

## 2020-03-04 NOTE — Progress Notes (Signed)
   Covid-19 Vaccination Clinic  Name:  Kristen Walls    MRN: [id number] DOB: Sep 20, 1956  03/04/2020  Kristen Walls was observed post Covid-19 immunization for 15 minutes without incident. She was provided with Vaccine Information Sheet and instruction to access the V-Safe system.   Kristen Walls was instructed to call 911 with any severe reactions post vaccine: [name] Kitchen Difficulty breathing  . Swelling of face and throat  . A fast heartbeat  . A bad rash all over body  . Dizziness and weakness   Immunizations Administered    Name Date Dose VIS Date Route   Pfizer COVID-19 Vaccine 03/04/2020  2:03 PM 0.3 mL 11/16/2019 Intramuscular   Manufacturer: [place]   Lot: [id number]   [place]: [id number]

## 2020-10-15 ENCOUNTER — Other Ambulatory Visit
Admission: RE | Admit: 2020-10-15 | Discharge: 2020-10-15 | Disposition: A | Discharge status: Home / Self Care | Payer: Medicare Other | Source: Ambulatory Visit | Attending: Gastroenterology | Admitting: Gastroenterology

## 2020-10-15 ENCOUNTER — Other Ambulatory Visit: Payer: Self-pay

## 2020-10-15 DIAGNOSIS — Z01818 Encounter for other preprocedural examination: Secondary | ICD-10-CM | POA: Diagnosis present

## 2020-10-15 DIAGNOSIS — Z20822 Contact with and (suspected) exposure to covid-19: Secondary | ICD-10-CM | POA: Insufficient documentation

## 2020-10-15 LAB — SARS CORONAVIRUS 2 (TAT 6-24 HRS): SARS Coronavirus 2: NEGATIVE

## 2020-10-16 ENCOUNTER — Encounter: Payer: Self-pay | Admitting: *Deleted

## 2020-10-17 ENCOUNTER — Encounter
Admission: RE | Disposition: A | Discharge status: Home / Self Care | Payer: Self-pay | Source: Home / Self Care | Attending: Gastroenterology

## 2020-10-17 ENCOUNTER — Ambulatory Visit
Admission: RE | Admit: 2020-10-17 | Discharge: 2020-10-17 | Disposition: A | Discharge status: Home / Self Care | Payer: Medicare Other | Attending: Gastroenterology | Admitting: Gastroenterology

## 2020-10-17 ENCOUNTER — Encounter: Payer: Self-pay | Admitting: *Deleted

## 2020-10-17 ENCOUNTER — Ambulatory Visit: Payer: Medicare Other | Admitting: Certified Registered"

## 2020-10-17 ENCOUNTER — Other Ambulatory Visit: Payer: Self-pay

## 2020-10-17 DIAGNOSIS — Z800 Family history of malignant neoplasm of digestive organs: Secondary | ICD-10-CM | POA: Diagnosis not present

## 2020-10-17 DIAGNOSIS — Z1211 Encounter for screening for malignant neoplasm of colon: Secondary | ICD-10-CM | POA: Insufficient documentation

## 2020-10-17 DIAGNOSIS — K640 First degree hemorrhoids: Secondary | ICD-10-CM | POA: Insufficient documentation

## 2020-10-17 DIAGNOSIS — Z79899 Other long term (current) drug therapy: Secondary | ICD-10-CM | POA: Insufficient documentation

## 2020-10-17 DIAGNOSIS — Z09 Encounter for follow-up examination after completed treatment for conditions other than malignant neoplasm: Secondary | ICD-10-CM | POA: Insufficient documentation

## 2020-10-17 DIAGNOSIS — Z888 Allergy status to other drugs, medicaments and biological substances status: Secondary | ICD-10-CM | POA: Insufficient documentation

## 2020-10-17 DIAGNOSIS — Z86010 Personal history of colonic polyps: Secondary | ICD-10-CM | POA: Diagnosis not present

## 2020-10-17 DIAGNOSIS — D123 Benign neoplasm of transverse colon: Secondary | ICD-10-CM | POA: Diagnosis not present

## 2020-10-17 HISTORY — DX: Diverticulosis of intestine, part unspecified, without perforation or abscess without bleeding: K57.90

## 2020-10-17 HISTORY — DX: Benign neoplasm of colon, unspecified: D12.6

## 2020-10-17 HISTORY — PX: COLONOSCOPY WITH PROPOFOL: SHX5780

## 2020-10-17 HISTORY — DX: Polyp of colon: K63.5

## 2020-10-17 SURGERY — COLONOSCOPY WITH PROPOFOL
Anesthesia: General

## 2020-10-17 MED ORDER — ESMOLOL HCL 100 MG/10ML IV SOLN
INTRAVENOUS | Status: DC | PRN
  Administered 2020-10-17: 10 mg via INTRAVENOUS

## 2020-10-17 MED ORDER — GLYCOPYRROLATE 0.2 MG/ML IJ SOLN
INTRAMUSCULAR | Status: DC | PRN
  Administered 2020-10-17: .2 mg via INTRAVENOUS

## 2020-10-17 MED ORDER — SODIUM CHLORIDE 0.9 % IV SOLN
INTRAVENOUS | Status: DC
  Administered 2020-10-17: 1000 mL via INTRAVENOUS

## 2020-10-17 MED ORDER — PROPOFOL 500 MG/50ML IV EMUL
INTRAVENOUS | Status: DC | PRN
  Administered 2020-10-17: 155 ug/kg/min via INTRAVENOUS

## 2020-10-17 MED ORDER — LIDOCAINE HCL (CARDIAC) PF 100 MG/5ML IV SOSY
PREFILLED_SYRINGE | INTRAVENOUS | Status: DC | PRN
  Administered 2020-10-17: 100 mg via INTRAVENOUS

## 2020-10-17 MED ORDER — PROPOFOL 10 MG/ML IV BOLUS
INTRAVENOUS | Status: DC | PRN
  Administered 2020-10-17 (×2): 20 mg via INTRAVENOUS
  Administered 2020-10-17: 50 mg via INTRAVENOUS

## 2020-10-17 MED ORDER — LIDOCAINE HCL (PF) 1 % IJ SOLN
INTRAMUSCULAR | Status: AC
  Administered 2020-10-17: 0.3 mL
  Filled 2020-10-17: qty 2

## 2020-10-17 MED ORDER — PHENYLEPHRINE HCL (PRESSORS) 10 MG/ML IV SOLN
INTRAVENOUS | Status: DC | PRN
  Administered 2020-10-17: 100 ug via INTRAVENOUS

## 2020-10-17 NOTE — Transfer of Care (Signed)
Immediate Anesthesia Transfer of Care Note  Patient: SORAIDA VICKERS  Procedure(s) Performed: COLONOSCOPY WITH PROPOFOL (N/A )  Patient Location: Endoscopy Unit  Anesthesia Type:General  Level of Consciousness: awake, drowsy and patient cooperative  Airway & Oxygen Therapy: Patient Spontanous Breathing  Post-op Assessment: Report given to RN and Post -op Vital signs reviewed and stable  Post vital signs: Reviewed and stable  Last Vitals:  Vitals Value Taken Time  BP 104/46 10/17/20 1340  Temp 36.6 C 10/17/20 1336  Pulse 116 10/17/20 1341  Resp 17 10/17/20 1341  SpO2 97 % 10/17/20 1341  Vitals shown include unvalidated device data.  Last Pain:  Vitals:   10/17/20 1336  TempSrc: Temporal  PainSc: 0-No pain         Complications: No complications documented.

## 2020-10-17 NOTE — Anesthesia Procedure Notes (Signed)
Procedure Name: General with mask airway Performed by: Fletcher-Harrison, Bear Osten, CRNA Pre-anesthesia Checklist: Patient identified, Emergency Drugs available, Suction available and Patient being monitored Patient Re-evaluated:Patient Re-evaluated prior to induction Oxygen Delivery Method: Simple face mask Induction Type: IV induction Placement Confirmation: positive ETCO2 and CO2 detector Dental Injury: Teeth and Oropharynx as per pre-operative assessment        

## 2020-10-17 NOTE — Op Note (Signed)
[facility] Patient Name: Kristen Walls Procedure Date: 10/17/2020 12:53 PM MRN: [id number] Account #: [id number] Date of Birth: 23-Oct-1956 Admit Type: Outpatient Age: 64 Room: [facility] ENDO ROOM 3 Gender: Female Note Status: Finalized Procedure:             Colonoscopy Indications:           High risk colon cancer surveillance: Personal history                         of sessile serrated colon polyp (10 mm or greater in                         size) Providers:             Andrey Farmer MD, MD Referring MD:          Irven Easterly. Kary Kos, MD (Referring MD) Medicines:             Monitored Anesthesia Care Complications:         No immediate complications. Estimated blood loss:                         Minimal. Procedure:             Pre-Anesthesia Assessment:                        - Prior to the procedure, a History and Physical was                         performed, and patient medications and allergies were                         reviewed. The patient is competent. The risks and                         benefits of the procedure and the sedation options and                         risks were discussed with the patient. All questions                         were answered and informed consent was obtained.                         Patient identification and proposed procedure were                         verified by the physician, the nurse, the anesthetist                         and the technician in the endoscopy suite. Mental                         Status Examination: alert and oriented. Airway                         Examination: normal oropharyngeal airway and neck  mobility. Respiratory Examination: clear to                         auscultation. CV Examination: normal. Prophylactic                         Antibiotics: The patient does not require prophylactic                         antibiotics. Prior Anticoagulants: The  patient has                         taken no previous anticoagulant or antiplatelet                         agents. ASA Grade Assessment: II - A patient with mild                         systemic disease. After reviewing the risks and                         benefits, the patient was deemed in satisfactory                         condition to undergo the procedure. The anesthesia                         plan was to use monitored anesthesia care (MAC).                         Immediately prior to administration of medications,                         the patient was re-assessed for adequacy to receive                         sedatives. The heart rate, respiratory rate, oxygen                         saturations, blood pressure, adequacy of pulmonary                         ventilation, and response to care were monitored                         throughout the procedure. The physical status of the                         patient was re-assessed after the procedure.                        After obtaining informed consent, the colonoscope was                         passed under direct vision. Throughout the procedure,                         the patient's blood pressure, pulse, and oxygen  saturations were monitored continuously. The                         Colonoscope was introduced through the anus and                         advanced to the the cecum, identified by appendiceal                         orifice and ileocecal valve. The colonoscopy was                         performed without difficulty. The patient tolerated                         the procedure well. The quality of the bowel                         preparation was good. Findings:      The perianal and digital rectal examinations were normal.      A 4 mm polyp was found in the proximal transverse colon. The polyp was       sessile. The polyp was removed with a cold snare. Resection and       retrieval  were complete. Estimated blood loss was minimal.      Two sessile polyps were found in the distal transverse colon. The polyps       were 2 to 5 mm in size. These polyps were removed with a cold snare.       Resection and retrieval were complete. Estimated blood loss was minimal.      A 2 mm polyp was found in the distal transverse colon. The polyp was       sessile. The polyp was removed with a jumbo cold forceps. Resection and       retrieval were complete. Estimated blood loss was minimal.      Non-bleeding internal hemorrhoids were found during retroflexion. The       hemorrhoids were Grade I (internal hemorrhoids that do not prolapse).      The exam was otherwise without abnormality on direct and retroflexion       views. Impression:            - One 4 mm polyp in the proximal transverse colon,                         removed with a cold snare. Resected and retrieved.                        - Two 2 to 5 mm polyps in the distal transverse colon,                         removed with a cold snare. Resected and retrieved.                        - One 2 mm polyp in the distal transverse colon,                         removed with a jumbo cold forceps. Resected and  retrieved.                        - Non-bleeding internal hemorrhoids.                        - The examination was otherwise normal on direct and                         retroflexion views. Recommendation:        - Discharge patient to home.                        - Resume previous diet.                        - Continue present medications.                        - Await pathology results.                        - Repeat colonoscopy for surveillance based on                         pathology results.                        - Return to referring physician as previously                         scheduled. Procedure Code(s):     --- Professional ---                        [id number], Colonoscopy, flexible;  with removal of                         tumor(s), polyp(s), or other lesion(s) by snare                         technique                        45380, 53, Colonoscopy, flexible; with biopsy, single                         or multiple Diagnosis Code(s):     --- Professional ---                        K63.5, Polyp of colon                        Z86.010, Personal history of colonic polyps                        K64.0, First degree hemorrhoids CPT copyright 2019 American [employer] Association. All rights reserved. The codes documented in this report are preliminary and upon coder review may  be revised to meet current compliance requirements. Andrey Farmer, MD Andrey Farmer MD, MD 10/17/2020 1:37:59 PM Number of Addenda: 0 Note Initiated On: 10/17/2020 12:53 PM Estimated Blood Loss:  Estimated blood loss was minimal.      [facility]  [facility]

## 2020-10-17 NOTE — Interval H&P Note (Signed)
History and Physical Interval Note:  10/17/2020 1:05 PM  Kristen Walls  has presented today for surgery, with the diagnosis of History of adenomatous colon polyp.  The various methods of treatment have been discussed with the patient and family. After consideration of risks, benefits and other options for treatment, the patient has consented to  Procedure(s): COLONOSCOPY WITH PROPOFOL (N/A) as a surgical intervention.  The patient's history has been reviewed, patient examined, no change in status, stable for surgery.  I have reviewed the patient's chart and labs.  Questions were answered to the patient's satisfaction.     Lesly Rubenstein  Ok to proceed with colonoscopy

## 2020-10-17 NOTE — Anesthesia Postprocedure Evaluation (Signed)
Anesthesia Post Note  Patient: Kristen Walls  Procedure(s) Performed: COLONOSCOPY WITH PROPOFOL (N/A )  Patient location during evaluation: Endoscopy Anesthesia Type: General Level of consciousness: awake and alert and oriented Pain management: pain level controlled Vital Signs Assessment: post-procedure vital signs reviewed and stable Respiratory status: spontaneous breathing, nonlabored ventilation and respiratory function stable Cardiovascular status: blood pressure returned to baseline and stable Postop Assessment: no signs of nausea or vomiting Anesthetic complications: no   No complications documented.   Last Vitals:  Vitals:   10/17/20 1336 10/17/20 1406  BP: 104/62 119/69  Pulse: (!) 101   Resp: 15   Temp: 36.6 C   SpO2: 99%     Last Pain:  Vitals:   10/17/20 1406  TempSrc:   PainSc: 0-No pain                 [name]

## 2020-10-17 NOTE — Anesthesia Preprocedure Evaluation (Signed)
Anesthesia Evaluation  Patient identified by MRN, date of birth, ID band Patient awake    Reviewed: Allergy & Precautions, NPO status , Patient's Chart, lab work & pertinent test results  History of Anesthesia Complications Negative for: history of anesthetic complications  Airway Mallampati: II  TM Distance: >3 FB Neck ROM: Full    Dental  (+) Partial Upper   Pulmonary neg sleep apnea, neg COPD, Current Smoker (vapes)Patient did not abstain from smoking.,    breath sounds clear to auscultation- rhonchi (-) wheezing      Cardiovascular Exercise Tolerance: Good (-) hypertension(-) CAD, (-) Past MI, (-) Cardiac Stents and (-) CABG  Rhythm:Regular Rate:Normal - Systolic murmurs and - Diastolic murmurs    Neuro/Psych neg Seizures PSYCHIATRIC DISORDERS Depression negative neurological ROS     GI/Hepatic negative GI ROS, Neg liver ROS,   Endo/Other  negative endocrine ROSneg diabetes  Renal/GU negative Renal ROS     Musculoskeletal negative musculoskeletal ROS (+)   Abdominal (+) - obese,   Peds  Hematology negative hematology ROS (+)   Anesthesia Other Findings Past Medical History: No date: Depression No date: Diverticulosis No date: Hyperplastic colon polyp No date: Serrated adenoma of colon   Reproductive/Obstetrics                             Anesthesia Physical Anesthesia Plan  ASA: II  Anesthesia Plan: General   Post-op Pain Management:    Induction: Intravenous  PONV Risk Score and Plan: Propofol infusion  Airway Management Planned: Natural Airway  Additional Equipment:   Intra-op Plan:   Post-operative Plan:   Informed Consent: I have reviewed the patients History and Physical, chart, labs and discussed the procedure including the risks, benefits and alternatives for the proposed anesthesia with the patient or authorized representative who has indicated his/her  understanding and acceptance.     Dental advisory given  Plan Discussed with: CRNA and Anesthesiologist  Anesthesia Plan Comments:         Anesthesia Quick Evaluation

## 2020-10-17 NOTE — H&P (Signed)
Outpatient short stay form Pre-procedure 10/17/2020 1:03 PM Kristen Miyamoto MD, MPH  Primary Physician: Dr. Kary Kos  Reason for visit:  Surveillance  History of present illness:   64 y/o lady with history of large SSA here for surveillance colonoscopy. Father with colon cancer in his 40's. History of c-section but no other abdominals surgery. No blood thinners.    Current Facility-Administered Medications:  .  0.9 %  sodium chloride infusion, , Intravenous, Continuous, Analys Ryden, Hilton Cork, MD, Last Rate: 20 mL/hr at 10/17/20 1233, 1,000 mL at 10/17/20 1233  Medications Prior to Admission  Medication Sig Dispense Refill Last Dose  . diphenhydrAMINE (BENADRYL) 25 mg capsule Take 25 mg by mouth as needed.     . cholecalciferol (VITAMIN D) 400 units TABS tablet Take 1,000 Units by mouth.     . Multiple Vitamin (MULTIVITAMIN) tablet Take 1 tablet by mouth daily.     . OMEGA-3 FATTY ACIDS-VITAMIN E PO Take 1,000 mg by mouth daily.     . sertraline (ZOLOFT) 50 MG tablet Take 50 mg by mouth daily.        Allergies  Allergen Reactions  . Wellbutrin [Bupropion] Hives     Past Medical History:  Diagnosis Date  . Depression   . Diverticulosis   . Hyperplastic colon polyp   . Serrated adenoma of colon     Review of systems:  Otherwise negative.    Physical Exam  Gen: Alert, oriented. Appears stated age.  HEENT:  PERRLA. Lungs: No respiratory distress CV: RRR Abd: soft, benign, no masses. Ext: No edema.     Planned procedures: Proceed with colonoscopy. The patient understands the nature of the planned procedure, indications, risks, alternatives and potential complications including but not limited to bleeding, infection, perforation, damage to internal organs and possible oversedation/side effects from anesthesia. The patient agrees and gives consent to proceed.  Please refer to procedure notes for findings, recommendations and patient disposition/instructions.     Kristen Miyamoto MD, MPH Gastroenterology 10/17/2020  1:03 PM

## 2020-10-20 ENCOUNTER — Encounter: Payer: Self-pay | Admitting: Gastroenterology

## 2020-10-20 LAB — SURGICAL PATHOLOGY

## 2020-10-21 ENCOUNTER — Encounter: Payer: Self-pay | Admitting: Gastroenterology

## 2020-11-11 ENCOUNTER — Ambulatory Visit: Payer: Medicare Other | Attending: Internal Medicine

## 2020-11-11 ENCOUNTER — Other Ambulatory Visit: Payer: Self-pay | Admitting: Internal Medicine

## 2020-11-11 DIAGNOSIS — Z23 Encounter for immunization: Secondary | ICD-10-CM

## 2020-11-11 NOTE — Progress Notes (Signed)
   Covid-19 Vaccination Clinic  Name:  Kristen Walls    MRN: [id number] DOB: Jun 18, 1956  11/11/2020  Ms. [name] was observed post Covid-19 immunization for 15 minutes without incident. She was provided with Vaccine Information Sheet and instruction to access the V-Safe system.   [name] was instructed to call 911 with any severe reactions post vaccine: [name] Kitchen Difficulty breathing  . Swelling of face and throat  . A fast heartbeat  . A bad rash all over body  . Dizziness and weakness   Immunizations Administered    Name Date Dose VIS Date Route   Pfizer COVID-19 Vaccine 11/11/2020  1:55 PM 0.3 mL 09/24/2020 Intramuscular   Manufacturer: [place]   Lot: [id number]   [place]: [id number]

## 2023-07-05 ENCOUNTER — Other Ambulatory Visit: Payer: Self-pay | Admitting: Family Medicine

## 2023-07-05 DIAGNOSIS — Z1231 Encounter for screening mammogram for malignant neoplasm of breast: Secondary | ICD-10-CM

## 2023-07-14 ENCOUNTER — Encounter

## 2023-08-02 ENCOUNTER — Ambulatory Visit
Admission: RE | Admit: 2023-08-02 | Discharge: 2023-08-02 | Disposition: A | Discharge status: Home / Self Care | Payer: Medicare Other | Source: Ambulatory Visit | Attending: Family Medicine | Admitting: Family Medicine

## 2023-08-02 DIAGNOSIS — Z1231 Encounter for screening mammogram for malignant neoplasm of breast: Secondary | ICD-10-CM | POA: Insufficient documentation

## 2024-01-09 ENCOUNTER — Encounter: Payer: Self-pay | Admitting: *Deleted

## 2024-01-16 ENCOUNTER — Ambulatory Visit: Payer: Medicare Other | Admitting: Anesthesiology

## 2024-01-16 ENCOUNTER — Encounter
Admission: RE | Disposition: A | Discharge status: Home / Self Care | Payer: Self-pay | Source: Home / Self Care | Attending: Gastroenterology

## 2024-01-16 ENCOUNTER — Encounter: Payer: Self-pay | Admitting: *Deleted

## 2024-01-16 ENCOUNTER — Ambulatory Visit
Admission: RE | Admit: 2024-01-16 | Discharge: 2024-01-16 | Disposition: A | Discharge status: Home / Self Care | Payer: Medicare Other | Attending: Gastroenterology | Admitting: Gastroenterology

## 2024-01-16 ENCOUNTER — Other Ambulatory Visit: Payer: Self-pay

## 2024-01-16 DIAGNOSIS — K640 First degree hemorrhoids: Secondary | ICD-10-CM | POA: Diagnosis not present

## 2024-01-16 DIAGNOSIS — Z1211 Encounter for screening for malignant neoplasm of colon: Secondary | ICD-10-CM | POA: Insufficient documentation

## 2024-01-16 DIAGNOSIS — Z800 Family history of malignant neoplasm of digestive organs: Secondary | ICD-10-CM | POA: Insufficient documentation

## 2024-01-16 DIAGNOSIS — Z87891 Personal history of nicotine dependence: Secondary | ICD-10-CM | POA: Diagnosis not present

## 2024-01-16 DIAGNOSIS — K5730 Diverticulosis of large intestine without perforation or abscess without bleeding: Secondary | ICD-10-CM | POA: Insufficient documentation

## 2024-01-16 HISTORY — PX: COLONOSCOPY WITH PROPOFOL: SHX5780

## 2024-01-16 SURGERY — COLONOSCOPY WITH PROPOFOL
Anesthesia: General

## 2024-01-16 MED ORDER — SODIUM CHLORIDE 0.9 % IV SOLN: INTRAVENOUS | Status: DC

## 2024-01-16 MED ORDER — PROPOFOL 1000 MG/100ML IV EMUL
INTRAVENOUS | Status: AC
Start: 2024-01-16 — End: ?
  Filled 2024-01-16: qty 100

## 2024-01-16 MED ORDER — PROPOFOL 500 MG/50ML IV EMUL
INTRAVENOUS | Status: DC | PRN
  Administered 2024-01-16: 150 ug/kg/min via INTRAVENOUS

## 2024-01-16 MED ORDER — PROPOFOL 10 MG/ML IV BOLUS
INTRAVENOUS | Status: DC | PRN
  Administered 2024-01-16: 75 mg via INTRAVENOUS

## 2024-01-16 NOTE — H&P (Signed)
 Outpatient short stay form Pre-procedure 01/16/2024  Kristen Darling, MD  Primary Physician: Kristen San, MD  Reason for visit:  Surveillance  History of present illness:    68 y/o lady with history of multiple SSA's of the colon here for surveillance colonoscopy. Father with colon cancer in his 10's or 9's. No blood thinners. History of three c-sections.    Current Facility-Administered Medications:    0.9 %  sodium chloride  infusion, , Intravenous, Continuous, Kristen Walls, Leanora Prophet, MD, Last Rate: 20 mL/hr at 01/16/24 1044, New Bag at 01/16/24 1044  Medications Prior to Admission  Medication Sig Dispense Refill Last Dose/Taking   cholecalciferol (VITAMIN D) 400 units TABS tablet Take 1,000 Units by mouth. (Patient not taking: Reported on 01/16/2024)   Not Taking   diphenhydrAMINE (BENADRYL) 25 mg capsule Take 25 mg by mouth as needed.      Multiple Vitamin (MULTIVITAMIN) tablet Take 1 tablet by mouth daily. (Patient not taking: Reported on 01/16/2024)   Not Taking   OMEGA-3 FATTY ACIDS-VITAMIN E PO Take 1,000 mg by mouth daily. (Patient not taking: Reported on 01/16/2024)   Not Taking   sertraline (ZOLOFT) 50 MG tablet Take 50 mg by mouth daily. (Patient not taking: Reported on 01/16/2024)   Not Taking     Allergies  Allergen Reactions   Wellbutrin [Bupropion] Hives     Past Medical History:  Diagnosis Date   Depression    Diverticulosis    Hyperplastic colon polyp    Serrated adenoma of colon     Review of systems:  Otherwise negative.    Physical Exam  Gen: Alert, oriented. Appears stated age.  HEENT: PERRLA. Lungs: No respiratory distress CV: RRR Abd: soft, benign, no masses Ext: No edema    Planned procedures: Proceed with colonoscopy. The patient understands the nature of the planned procedure, indications, risks, alternatives and potential complications including but not limited to bleeding, infection, perforation, damage to internal organs and possible  oversedation/side effects from anesthesia. The patient agrees and gives consent to proceed.  Please refer to procedure notes for findings, recommendations and patient disposition/instructions.     Kristen Darling, MD [facility]

## 2024-01-16 NOTE — Anesthesia Postprocedure Evaluation (Signed)
 Anesthesia Post Note  Patient: Kristen Walls  Procedure(s) Performed: COLONOSCOPY WITH PROPOFOL   Patient location during evaluation: Endoscopy Anesthesia Type: General Level of consciousness: awake and alert Pain management: pain level controlled Vital Signs Assessment: post-procedure vital signs reviewed and stable Respiratory status: spontaneous breathing, nonlabored ventilation and respiratory function stable Cardiovascular status: blood pressure returned to baseline and stable Postop Assessment: no apparent nausea or vomiting Anesthetic complications: no   There were no known notable events for this encounter.   Last Vitals:  Vitals:   01/16/24 1145 01/16/24 1155  BP: 105/75 (!) 126/94  Pulse: 86   Resp:    Temp:    SpO2: 99% 100%    Last Pain:  Vitals:   01/16/24 1155  TempSrc:   PainSc: 0-No pain                 Baltazar Bonier

## 2024-01-16 NOTE — Transfer of Care (Signed)
 Immediate Anesthesia Transfer of Care Note  Patient: Kristen Walls  Procedure(s) Performed: COLONOSCOPY WITH PROPOFOL   Patient Location: PACU  Anesthesia Type:General  Level of Consciousness: awake and sedated  Airway & Oxygen Therapy: Patient Spontanous Breathing and Patient connected to nasal cannula oxygen  Post-op Assessment: Report given to RN and Post -op Vital signs reviewed and stable  Post vital signs: Reviewed and stable  Last Vitals:  Vitals Value Taken Time  BP    Temp    Pulse    Resp    SpO2      Last Pain:  Vitals:   01/16/24 1033  TempSrc: Temporal  PainSc: 0-No pain         Complications: There were no known notable events for this encounter.

## 2024-01-16 NOTE — Interval H&P Note (Signed)
 History and Physical Interval Note:  01/16/2024 11:14 AM  Edwin Grain  has presented today for surgery, with the diagnosis of Z86.0100 (ICD-10-CM) - History of colon polyps.  The various methods of treatment have been discussed with the patient and family. After consideration of risks, benefits and other options for treatment, the patient has consented to  Procedure(s): COLONOSCOPY WITH PROPOFOL  (N/A) as a surgical intervention.  The patient's history has been reviewed, patient examined, no change in status, stable for surgery.  I have reviewed the patient's chart and labs.  Questions were answered to the patient's satisfaction.     [name]  Ok to proceed with colonoscopy

## 2024-01-16 NOTE — Anesthesia Preprocedure Evaluation (Addendum)
 Anesthesia Evaluation  Patient identified by MRN, date of birth, ID band Patient awake    Reviewed: Allergy & Precautions, H&P , NPO status , Patient's Chart, lab work & pertinent test results  Airway Mallampati: II  TM Distance: >3 FB Neck ROM: full    Dental no notable dental hx.    Pulmonary former smoker   Pulmonary exam normal        Cardiovascular negative cardio ROS Normal cardiovascular exam     Neuro/Psych negative neurological ROS  negative psych ROS   GI/Hepatic negative GI ROS, Neg liver ROS,,,  Endo/Other  negative endocrine ROS    Renal/GU negative Renal ROS  negative genitourinary   Musculoskeletal   Abdominal Normal abdominal exam  (+)   Peds  Hematology negative hematology ROS (+)   Anesthesia Other Findings Past Medical History: No date: Depression No date: Diverticulosis No date: Hyperplastic colon polyp No date: Serrated adenoma of colon  Past Surgical History: 04/27/2011: BREAST BIOPSY; Left     Comment:  US  guided biopsy-complex fibroadenoma No date: CHOLECYSTECTOMY 07/09/2011: COLON BIOPSY 03/11/2017: COLONOSCOPY WITH PROPOFOL ; N/A     Comment:  Procedure: COLONOSCOPY WITH PROPOFOL ;  Surgeon: Deveron Fly, MD;  Location: [facility] ENDOSCOPY;  Service:               Endoscopy;  Laterality: N/A; 10/17/2020: COLONOSCOPY WITH PROPOFOL ; N/A     Comment:  Procedure: COLONOSCOPY WITH PROPOFOL ;  Surgeon:               [name], MD;  Location: [facility] ENDOSCOPY;                Service: Endoscopy;  Laterality: N/A;     Reproductive/Obstetrics negative OB ROS                             Anesthesia Physical Anesthesia Plan  ASA: 2  Anesthesia Plan: General   Post-op Pain Management: Minimal or no pain anticipated   Induction: Intravenous  PONV Risk Score and Plan: Propofol  infusion and TIVA  Airway Management Planned: Natural  Airway  Additional Equipment:   Intra-op Plan:   Post-operative Plan:   Informed Consent: I have reviewed the patients History and Physical, chart, labs and discussed the procedure including the risks, benefits and alternatives for the proposed anesthesia with the patient or authorized representative who has indicated his/her understanding and acceptance.     Dental Advisory Given  Plan Discussed with: CRNA and Surgeon  Anesthesia Plan Comments:         Anesthesia Quick Evaluation

## 2024-01-16 NOTE — Op Note (Signed)
 [facility] Patient Name: Kristen Walls Procedure Date: 01/16/2024 11:15 AM MRN: [id number] Account #: [id number] Date of Birth: 06-Feb-1956 Admit Type: Outpatient Age: 68 Room: [facility] ENDO ROOM 3 Gender: Female Note Status: Finalized Instrument Name: Charlyn Cooley [id number] Procedure:             Colonoscopy Indications:           Surveillance: Personal history of adenomatous polyps                         on last colonoscopy 3 years ago Providers:             Leida Puna MD, MD Referring MD:          Doug Gehrig. Dean Every, MD (Referring MD) Medicines:             Monitored Anesthesia Care Complications:         No immediate complications. Procedure:             Pre-Anesthesia Assessment:                        - Prior to the procedure, a History and Physical was                         performed, and patient medications and allergies were                         reviewed. The patient is competent. The risks and                         benefits of the procedure and the sedation options and                         risks were discussed with the patient. All questions                         were answered and informed consent was obtained.                         Patient identification and proposed procedure were                         verified by the physician, the nurse, the                         anesthesiologist, the anesthetist and the technician                         in the endoscopy suite. Mental Status Examination:                         alert and oriented. Airway Examination: normal                         oropharyngeal airway and neck mobility. Respiratory                         Examination: clear to auscultation. CV Examination:  normal. Prophylactic Antibiotics: The patient does not                         require prophylactic antibiotics. Prior                         Anticoagulants: The patient has taken no  anticoagulant                         or antiplatelet agents. ASA Grade Assessment: II - A                         patient with mild systemic disease. After reviewing                         the risks and benefits, the patient was deemed in                         satisfactory condition to undergo the procedure. The                         anesthesia plan was to use monitored anesthesia care                         (MAC). Immediately prior to administration of                         medications, the patient was re-assessed for adequacy                         to receive sedatives. The heart rate, respiratory                         rate, oxygen saturations, blood pressure, adequacy of                         pulmonary ventilation, and response to care were                         monitored throughout the procedure. The physical                         status of the patient was re-assessed after the                         procedure.                        After obtaining informed consent, the colonoscope was                         passed under direct vision. Throughout the procedure,                         the patient's blood pressure, pulse, and oxygen                         saturations were monitored continuously. The  Colonoscope was introduced through the anus and                         advanced to the the cecum, identified by appendiceal                         orifice and ileocecal valve. The colonoscopy was                         performed without difficulty. The patient tolerated                         the procedure well. The quality of the bowel                         preparation was good. The ileocecal valve, appendiceal                         orifice, and rectum were photographed. Findings:      The perianal and digital rectal examinations were normal.      Scattered large-mouthed and small-mouthed diverticula were found in the       sigmoid  colon, descending colon and transverse colon.      Internal hemorrhoids were found during retroflexion. The hemorrhoids       were Grade I (internal hemorrhoids that do not prolapse).      The exam was otherwise without abnormality on direct and retroflexion       views. Impression:            - Diverticulosis in the sigmoid colon, in the                         descending colon and in the transverse colon.                        - Internal hemorrhoids.                        - The examination was otherwise normal on direct and                         retroflexion views.                        - No specimens collected. Recommendation:        - Discharge patient to home.                        - Resume previous diet.                        - Continue present medications.                        - Repeat colonoscopy in 5 years for surveillance.                        - Return to referring physician as previously                         scheduled. Procedure  Code(s):     --- Professional ---                        G0105, Colorectal cancer screening; colonoscopy on                         individual at high risk Diagnosis Code(s):     --- Professional ---                        Z86.010, Personal history of colonic polyps                        K64.0, First degree hemorrhoids                        K57.30, Diverticulosis of large intestine without                         perforation or abscess without bleeding CPT copyright 2022 [employer]. All rights reserved. The codes documented in this report are preliminary and upon coder review may  be revised to meet current compliance requirements. Leida Puna MD, MD 01/16/2024 11:35:04 AM Number of Addenda: 0 Note Initiated On: 01/16/2024 11:15 AM Scope Withdrawal Time: 0 hours 6 minutes 55 seconds  Total Procedure Duration: 0 hours 11 minutes 20 seconds  Estimated Blood Loss:  Estimated blood loss: none.      [facility]

## 2024-01-17 ENCOUNTER — Encounter: Payer: Self-pay | Admitting: Gastroenterology

## 2024-06-18 ENCOUNTER — Other Ambulatory Visit: Payer: Self-pay | Admitting: Family Medicine

## 2024-06-18 DIAGNOSIS — Z1231 Encounter for screening mammogram for malignant neoplasm of breast: Secondary | ICD-10-CM

## 2024-08-02 ENCOUNTER — Ambulatory Visit
Admission: RE | Admit: 2024-08-02 | Discharge: 2024-08-02 | Disposition: A | Discharge status: Home / Self Care | Source: Ambulatory Visit | Attending: Family Medicine | Admitting: Family Medicine

## 2024-08-02 DIAGNOSIS — Z1231 Encounter for screening mammogram for malignant neoplasm of breast: Secondary | ICD-10-CM | POA: Diagnosis present
